# Patient Record
Sex: Male | Born: 2005 | Hispanic: Yes | Marital: Single | Smoking: Never smoker
Health system: Southern US, Community
[De-identification: ages and names within clinical notes are randomized; demographics above are authoritative.]

---
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
## 2023-06-06 ENCOUNTER — Ambulatory Visit
Admission: EM | Admit: 2023-06-06 | Discharge: 2023-06-06 | Disposition: A | Discharge status: Home / Self Care | Payer: MEDICAID

## 2023-06-06 DIAGNOSIS — L02416 Cutaneous abscess of left lower limb: Secondary | ICD-10-CM | POA: Diagnosis not present

## 2023-06-06 MED ORDER — SULFAMETHOXAZOLE-TRIMETHOPRIM 800-160 MG PO TABS: 1.0000 | ORAL_TABLET | Freq: Two times a day (BID) | ORAL | 0 refills | Status: AC

## 2023-06-06 NOTE — ED Provider Notes (Signed)
[facility]    CSN: [id number] Arrival date & time: 06/06/23  1203      History   Chief Complaint Chief Complaint  Patient presents with   Abscess    HPI Theodore [name] is a 17 y.o. male.   HPI  17 year old male with no significant past medical history presents for evaluation of an abscess on the outside of his left thigh.  He is here with his mother and she reports that she just noticed it today when she asked the patient about the blood on the floor of his bathroom.  He reports that he has had swelling to the outside of his left thigh for the past week.  He denies any injury or insect bite that he is aware of.  He reports that he did squeeze it and popped the abscess.  It is freely draining serosanguineous drainage.  History reviewed. No pertinent past medical history.  There are no problems to display for this patient.   History reviewed. No pertinent surgical history.     Home Medications    Prior to Admission medications   Medication Sig Start Date End Date Taking? Authorizing Provider  clindamycin-benzoyl peroxide (BENZACLIN) gel For flares, apply once daily in the morning as needed to red acne lesions until resolved 10/11/21  Yes [provider]  sulfamethoxazole-trimethoprim (BACTRIM DS) 800-160 MG tablet Take 1 tablet by mouth 2 (two) times daily for 10 days. 06/06/23 06/16/23 Yes Becky Augusta, NP    Family History History reviewed. No pertinent family history.  Social History Social History   Tobacco Use   Smoking status: Never   Smokeless tobacco: Never  Vaping Use   Vaping status: Never Used  Substance Use Topics   Alcohol use: Never   Drug use: Never     Allergies   Patient has no known allergies.   Review of Systems Review of Systems  Constitutional:  Negative for fever.  Skin:  Positive for color change and wound.     Physical Exam Triage Vital Signs ED Triage Vitals [06/06/23 1212]  Encounter Vitals Group      BP      Systolic BP Percentile      Diastolic BP Percentile      Pulse      Resp      Temp      Temp src      SpO2      Weight      Height      Head Circumference      Peak Flow      Pain Score 0     Pain Loc      Pain Education      Exclude from Growth Chart    No data found.  Updated Vital Signs BP 112/75 (BP Location: Left Arm)   Pulse 86   Temp 98.5 F (36.9 C) (Oral)   Resp 18   SpO2 95%   Visual Acuity Right Eye Distance:   Left Eye Distance:   Bilateral Distance:    Right Eye Near:   Left Eye Near:    Bilateral Near:     Physical Exam Vitals and nursing note reviewed.  Constitutional:      Appearance: Normal appearance. He is not ill-appearing.  HENT:     Head: Normocephalic and atraumatic.  Skin:    General: Skin is warm and dry.     Capillary Refill: Capillary refill takes less than 2 seconds.  Findings: Erythema and lesion present.  Neurological:     General: No focal deficit present.     Mental Status: He is alert and oriented to person, place, and time.      UC Treatments / Results  Labs (all labs ordered are listed, but only abnormal results are displayed) Labs Reviewed - No data to display  EKG   Radiology No results found.  Procedures Procedures (including critical care time)  Medications Ordered in UC Medications - No data to display  Initial Impression / Assessment and Plan / UC Course  I have reviewed the triage vital signs and the nursing notes.  Pertinent labs & imaging results that were available during my care of the patient were reviewed by me and considered in my medical decision making (see chart for details).   Patient is a nontoxic-appearing 17 year old male presenting for evaluation of abscess to his left thigh that is present for the last week . As you can see in image above, there is an erythematous and indurated area approximately size of a silver dollar on the proximal lateral aspect of the left  thigh.  It is freely draining serosanguineous drainage.  With slight pressure application Frank pus was exuded from an open wound tract.  I will have staff apply a dressing to the patient's leg and discharge him home with a diagnosis of abscess to his left thigh I started him on Bactrim DS twice daily for 10 days.  He should apply warm compresses to the abscess to help facilitate drainage.  Any increased redness, swelling, pain, or fever he should return for reevaluation.  Final Clinical Impressions(s) / UC Diagnoses   Final diagnoses:  Abscess of left thigh     Discharge Instructions      Take the Bactrim DS tablets twice daily with a full glass of water for 10 days for treatment of your skin abscess.  Keep a dressing in place until after all the drainage has stopped.  You will need to change the dressing if it becomes saturated.  She should change it at least twice daily and wash the wound with warm water and soap.  Pat it dry before applying a new dressing.  Apply warm compresses atop your dressing to help facilitate drainage.  If you have any increased redness, swelling, pain, or develop a fever you should return for reevaluation or seek care in the ER.     ED Prescriptions     Medication Sig Dispense Auth. Provider   sulfamethoxazole-trimethoprim (BACTRIM DS) 800-160 MG tablet Take 1 tablet by mouth 2 (two) times daily for 10 days. 20 tablet Becky Augusta, NP      PDMP not reviewed this encounter.   Becky Augusta, NP 06/06/23 1228

## 2023-06-06 NOTE — Discharge Instructions (Addendum)
Take the Bactrim DS tablets twice daily with a full glass of water for 10 days for treatment of your skin abscess.  Keep a dressing in place until after all the drainage has stopped.  You will need to change the dressing if it becomes saturated.  She should change it at least twice daily and wash the wound with warm water and soap.  Pat it dry before applying a new dressing.  Apply warm compresses atop your dressing to help facilitate drainage.  If you have any increased redness, swelling, pain, or develop a fever you should return for reevaluation or seek care in the ER.

## 2023-06-06 NOTE — ED Triage Notes (Signed)
Patient presents to UC for left leg abscess x 1 day. Pt states he noted it a week ago. Reports some drainage since yesterday. Has not applied any OTC meds.
